# Patient Record
Sex: Male | Born: 2000 | Race: White | Hispanic: No | Marital: Single
Health system: Southern US, Community
[De-identification: ages and names within clinical notes are randomized; demographics above are authoritative.]

---
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
## 2023-09-24 IMAGING — CR DG SHOULDER 2+V*R*
2 series · 2 of 2 positions shown · non-contrast
Comparison: None.

CLINICAL DATA: Right shoulder pain after wrestling injury. Possible
dislocation

EXAM:
RIGHT SHOULDER - 2+ VIEW

[w shoulder external right]
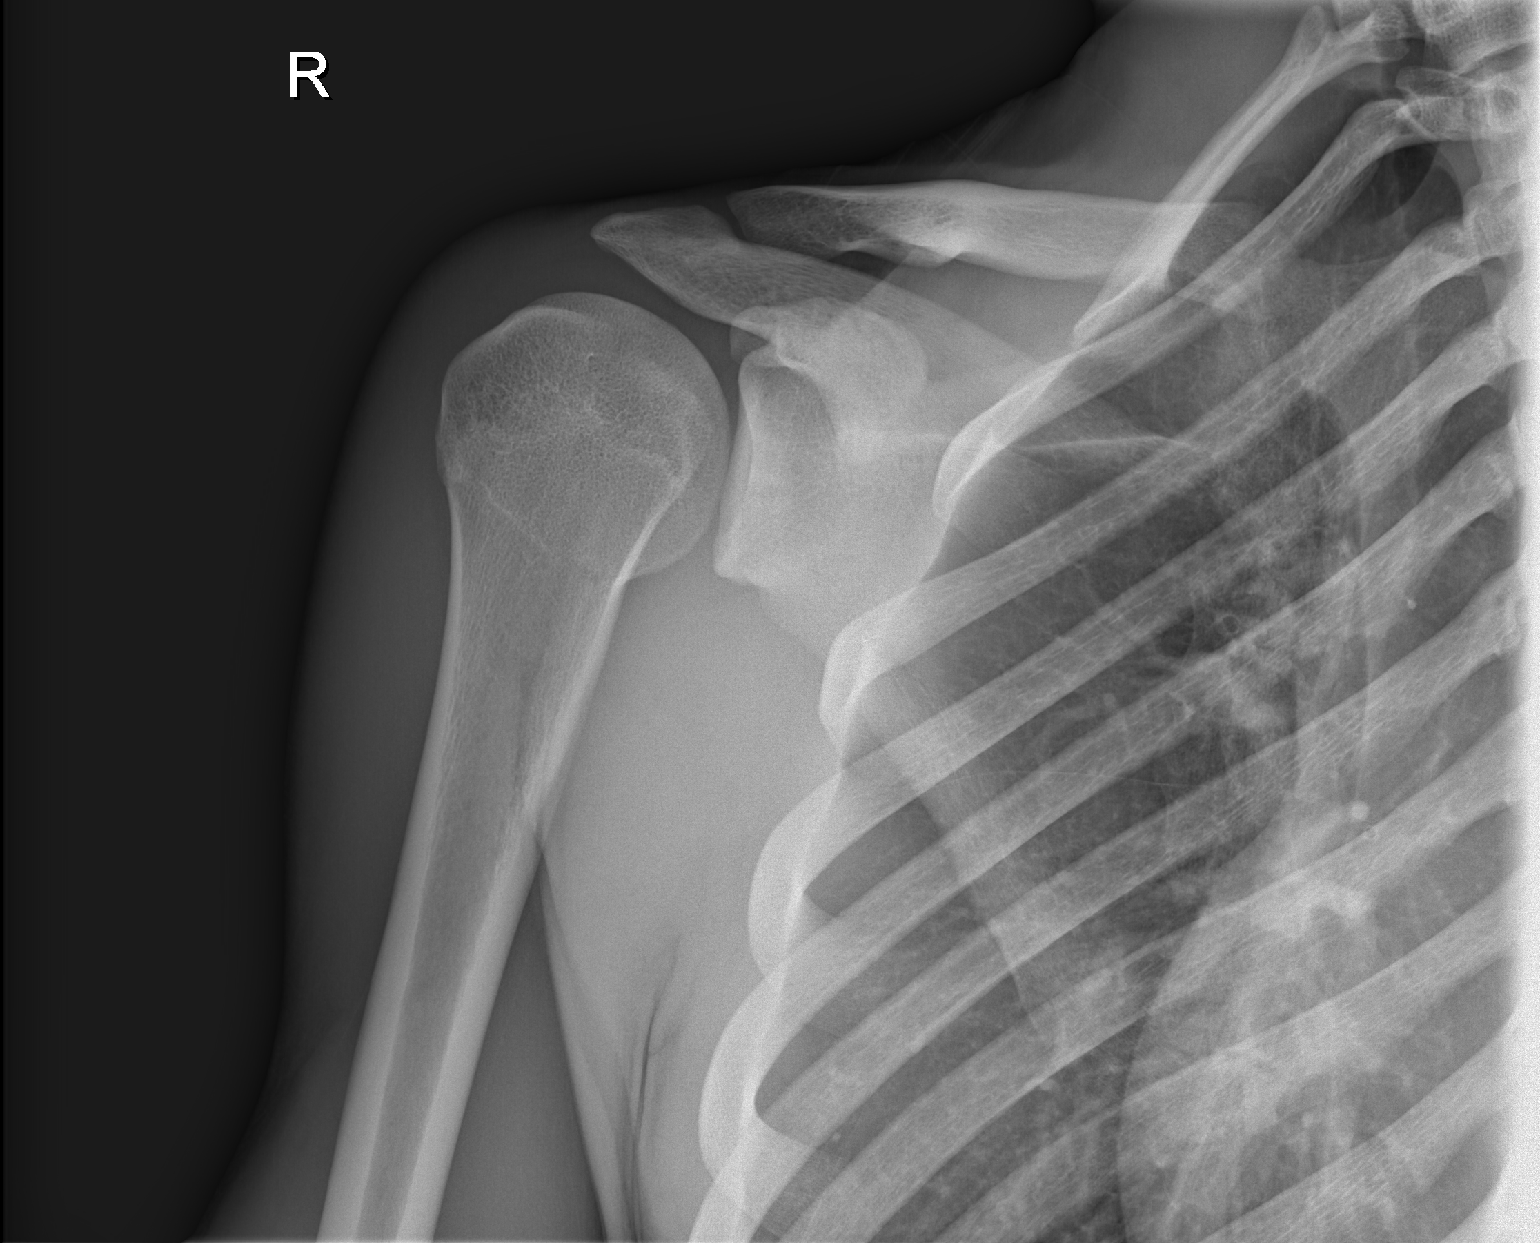

[w shoulder y-view right]
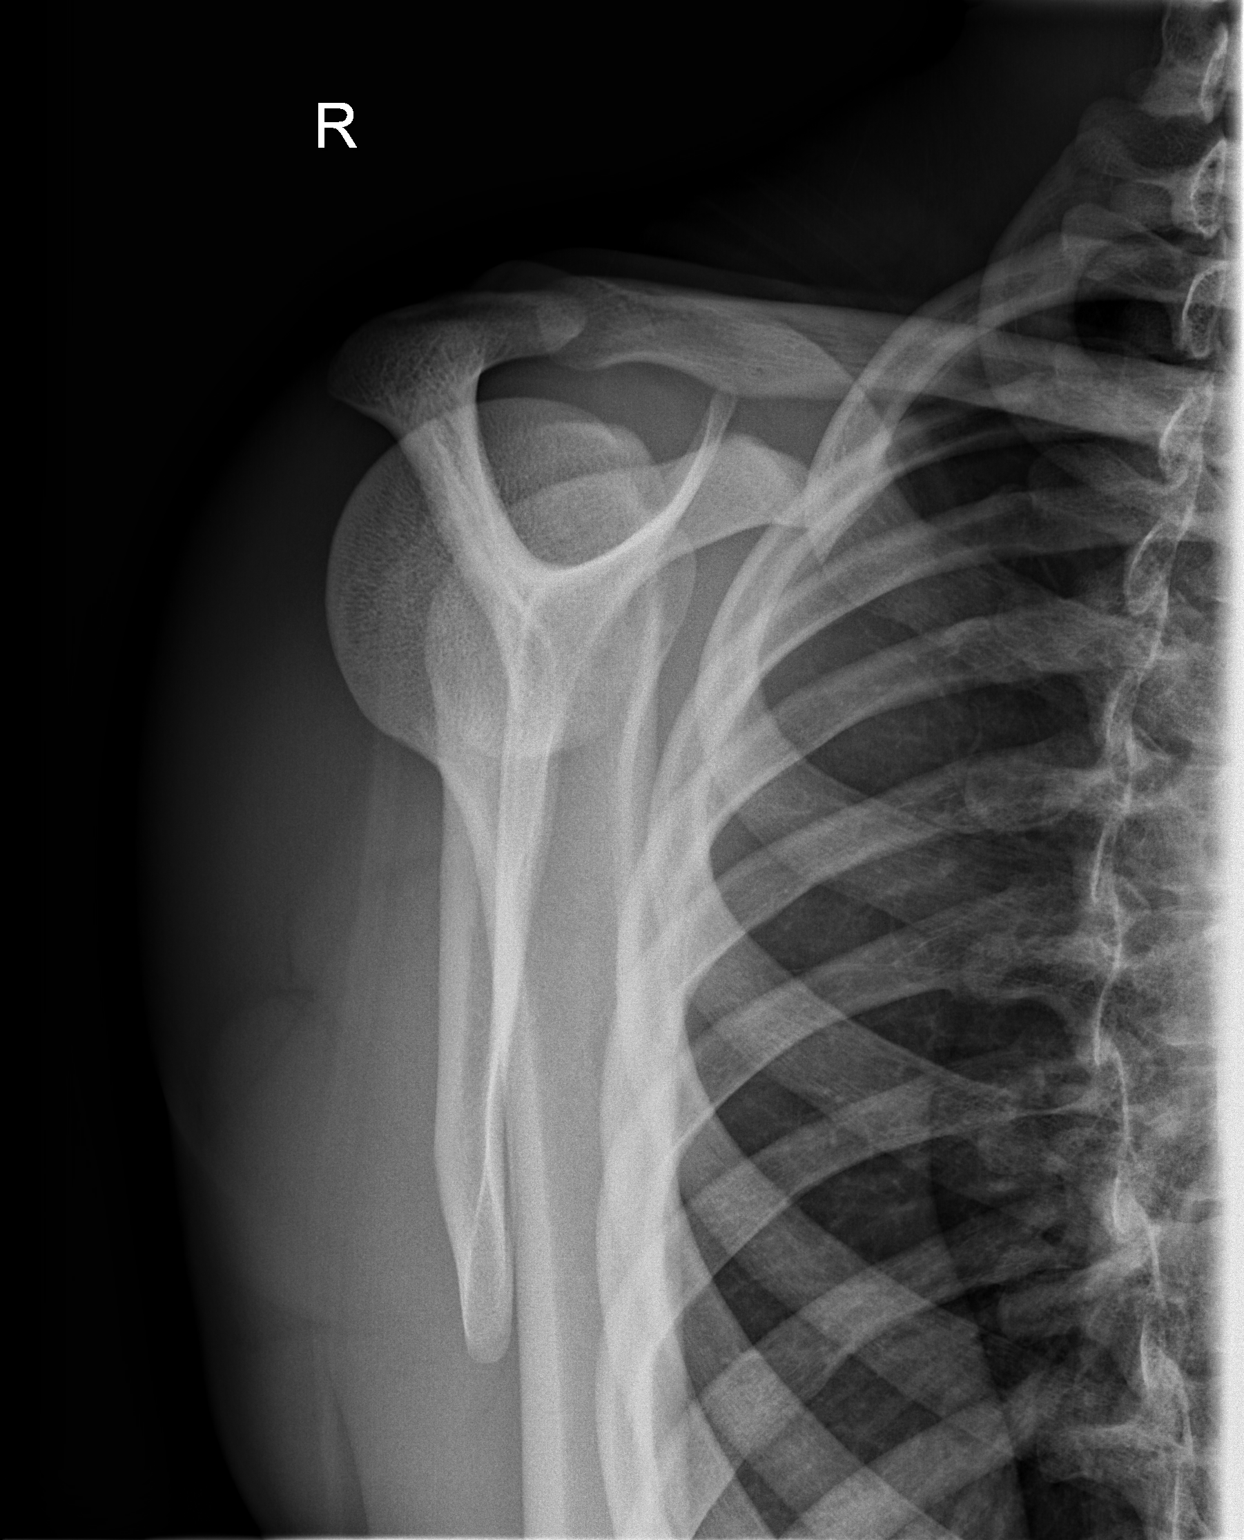

[2 of 2 positions shown; findings below may reference images not displayed]

FINDINGS: There is no evidence of fracture or dislocation. There is no
evidence of arthropathy or other focal bone abnormality. Soft
tissues are unremarkable.
IMPRESSION: Negative.
